# Patient Record
Sex: Male | Born: 1980 | Race: Asian | Hispanic: No | Marital: Married
Health system: Southern US, Community
[De-identification: ages and names within clinical notes are randomized; demographics above are authoritative.]

---

## 2015-05-25 ENCOUNTER — Emergency Department (HOSPITAL_BASED_OUTPATIENT_CLINIC_OR_DEPARTMENT_OTHER)
Admission: EM | Admit: 2015-05-25 | Discharge: 2015-05-25 | Disposition: A | Discharge status: Home / Self Care | Payer: BLUE CROSS/BLUE SHIELD | Attending: Emergency Medicine | Admitting: Emergency Medicine

## 2015-05-25 ENCOUNTER — Encounter (HOSPITAL_BASED_OUTPATIENT_CLINIC_OR_DEPARTMENT_OTHER): Payer: Self-pay

## 2015-05-25 ENCOUNTER — Emergency Department (HOSPITAL_BASED_OUTPATIENT_CLINIC_OR_DEPARTMENT_OTHER): Payer: BLUE CROSS/BLUE SHIELD

## 2015-05-25 DIAGNOSIS — R062 Wheezing: Secondary | ICD-10-CM | POA: Diagnosis present

## 2015-05-25 DIAGNOSIS — J988 Other specified respiratory disorders: Secondary | ICD-10-CM | POA: Insufficient documentation

## 2015-05-25 MED ORDER — PREDNISONE 20 MG PO TABS
40.0000 mg | ORAL_TABLET | Freq: Once | ORAL | Status: AC
  Administered 2015-05-25: 40 mg via ORAL
  Filled 2015-05-25: qty 2

## 2015-05-25 MED ORDER — IPRATROPIUM-ALBUTEROL 0.5-2.5 (3) MG/3ML IN SOLN
3.0000 mL | RESPIRATORY_TRACT | Status: AC
  Administered 2015-05-25 (×3): 3 mL via RESPIRATORY_TRACT
  Filled 2015-05-25: qty 3
  Filled 2015-05-25: qty 6

## 2015-05-25 MED ORDER — ALBUTEROL SULFATE HFA 108 (90 BASE) MCG/ACT IN AERS
8.0000 | INHALATION_SPRAY | Freq: Once | RESPIRATORY_TRACT | Status: AC
  Administered 2015-05-25: 8 via RESPIRATORY_TRACT
  Filled 2015-05-25: qty 6.7

## 2015-05-25 MED ORDER — PREDNISONE 20 MG PO TABS: ORAL_TABLET | ORAL | Status: DC

## 2015-05-25 MED ORDER — AEROCHAMBER PLUS W/MASK MISC
1.0000 | Freq: Once | Status: DC
  Filled 2015-05-25: qty 1

## 2015-05-25 NOTE — ED Notes (Signed)
Wheezing since yesterday. "Always happens during this season" No hx of asthma.

## 2015-05-25 NOTE — Discharge Instructions (Signed)
Use your inhaler ever four hours while awake for the next couple days.

## 2015-05-25 NOTE — ED Notes (Signed)
Pt states he feels better. BBS-mild crackles in bases.

## 2015-05-25 NOTE — ED Notes (Signed)
MD at bedside. 

## 2015-05-25 NOTE — ED Notes (Signed)
Allergy symptoms for two weeks with cough and wheezing starting two days ago.  He states this happens every year around this time.  Pt increasingly SOB as he speaks and ambulates.

## 2015-05-25 NOTE — ED Provider Notes (Signed)
CSN: [id number]     Arrival date & time 05/25/15  2117 History  By signing my name below, I, [facility], attest that this documentation has been prepared under the direction and in the presence of Melene Plan, DO. Electronically Signed: Randell Patient, ED Scribe. 05/25/2015. 10:47 PM.   Chief Complaint  Patient presents with  . Wheezing   Patient is a 35 y.o. male presenting with wheezing. The history is provided by the patient. No language interpreter was used.  Wheezing Severity:  Mild Severity compared to prior episodes:  Similar Onset quality:  Gradual Duration:  2 days Timing:  Intermittent Progression:  Unchanged Chronicity:  Recurrent Context: pollens   Worsened by:  Allergens Ineffective treatments:  Oral decongestants Associated symptoms: cough   Associated symptoms: no chest pain, no fever, no headaches, no rash and no shortness of breath    HPI Comments: Bradley Jarvis is a 35 y.o. male who presents to the Emergency Department complaining of intermittent, mild wheezing onset 2 days ago. Patient reports that he has had allergy symptoms including congestion for the past 2 weeks but that wheezing and cough presented simultaneously 2 days ago. He has taken OTC medications without relief. He notes similar symptoms in the past with seasonal changes during the spring. Denies hx of asthma. Denies SOB or any other symptoms currently.  History reviewed. No pertinent past medical history. History reviewed. No pertinent past surgical history. No family history on file. Social History  Substance Use Topics  . Smoking status: None  . Smokeless tobacco: None  . Alcohol Use: None    Review of Systems  Constitutional: Negative for fever and chills.  HENT: Positive for congestion. Negative for facial swelling.   Eyes: Negative for discharge and visual disturbance.  Respiratory: Positive for cough and wheezing. Negative for shortness of breath.   Cardiovascular: Negative for chest  pain and palpitations.  Gastrointestinal: Negative for vomiting, abdominal pain and diarrhea.  Musculoskeletal: Negative for myalgias and arthralgias.  Skin: Negative for color change and rash.  Neurological: Negative for tremors, syncope and headaches.  Psychiatric/Behavioral: Negative for confusion and dysphoric mood.    Allergies  Review of patient's allergies indicates no known allergies.  Home Medications   Prior to Admission medications   Not on File   SpO2 95% Physical Exam  Constitutional: He is oriented to person, place, and time. He appears well-developed and well-nourished.  HENT:  Head: Normocephalic and atraumatic.  Swollen turbinates and post-nasal drip.  Eyes: EOM are normal. Pupils are equal, round, and reactive to light.  Neck: Normal range of motion. Neck supple. No JVD present.  Cardiovascular: Normal rate and regular rhythm.  Exam reveals no gallop and no friction rub.   No murmur heard. Pulmonary/Chest: No respiratory distress. He has wheezes.  Diffuse wheezing in all lung fields. Prolonged expirations.   Abdominal: He exhibits no distension. There is no rebound and no guarding.  Musculoskeletal: Normal range of motion.  Neurological: He is alert and oriented to person, place, and time.  Skin: No rash noted. No pallor.  Psychiatric: He has a normal mood and affect. His behavior is normal.  Nursing note and vitals reviewed.   ED Course  Procedures   DIAGNOSTIC STUDIES: Oxygen Saturation is 96% on RA, adequate by my interpretation.    COORDINATION OF CARE: 9:51 PM Ordered breathing treatment, prednisone, and chest x-ray. Discussed treatment plan with pt at bedside and pt agreed to plan.  Imaging Review Dg Chest 2 View  05/25/2015  CLINICAL DATA:  Cough and wheezing EXAM: CHEST  2 VIEW COMPARISON:  None. FINDINGS: Lungs are clear. Heart size and pulmonary vascularity are normal. No adenopathy. No bone lesions. IMPRESSION: No edema or consolidation.  Electronically Signed   By: Bretta BangWilliam  Woodruff III M.D.   On: 05/25/2015 22:36   I have personally reviewed and evaluated these images as part of my medical decision-making.  MDM   Final diagnoses:  None    35 yo M With a chief complaint wheezing. Patient with significant improvement after 3 DuoNeb SPECT back and steroids. Discharge home with steroid burst therapy will have him use his inhaler every 4 hours while awake.  11:48 PM:  I have discussed the diagnosis/risks/treatment options with the patient and family and believe the pt to be eligible for discharge home to follow-up with PCP. We also discussed returning to the ED immediately if new or worsening sx occur. We discussed the sx which are most concerning (e.g., sudden worsening sob, need to use inhaler more often than every 4 hours, fever, inability to tolerate by mouth) that necessitate immediate return. Medications administered to the patient during their visit and any new prescriptions provided to the patient are listed below.  Medications given during this visit Medications  aerochamber plus with mask device 1 each (not administered)  ipratropium-albuterol (DUONEB) 0.5-2.5 (3) MG/3ML nebulizer solution 3 mL (3 mLs Nebulization Given 05/25/15 2213)  predniSONE (DELTASONE) tablet 40 mg (40 mg Oral Given 05/25/15 2216)  albuterol (PROVENTIL HFA;VENTOLIN HFA) 108 (90 Base) MCG/ACT inhaler 8 puff (8 puffs Inhalation Given 05/25/15 2315)    Discharge Medication List as of 05/25/2015 11:05 PM    START taking these medications   Details  predniSONE (DELTASONE) 20 MG tablet 2 tabs po daily x 4 days, Print        The patient appears reasonably screen and/or stabilized for discharge and I doubt any other medical condition or other [facility] requiring further screening, evaluation, or treatment in the ED at this time prior to discharge.     I personally performed the services described in this documentation, which was scribed in my presence.  The recorded information has been reviewed and is accurate.    Melene Planan Krithik Mapel, DO 05/25/15 2348

## 2015-06-06 ENCOUNTER — Emergency Department (HOSPITAL_BASED_OUTPATIENT_CLINIC_OR_DEPARTMENT_OTHER)
Admission: EM | Admit: 2015-06-06 | Discharge: 2015-06-06 | Disposition: A | Discharge status: Home / Self Care | Payer: BLUE CROSS/BLUE SHIELD | Attending: Emergency Medicine | Admitting: Emergency Medicine

## 2015-06-06 DIAGNOSIS — J45901 Unspecified asthma with (acute) exacerbation: Secondary | ICD-10-CM | POA: Diagnosis not present

## 2015-06-06 DIAGNOSIS — J302 Other seasonal allergic rhinitis: Secondary | ICD-10-CM | POA: Diagnosis not present

## 2015-06-06 DIAGNOSIS — Z79899 Other long term (current) drug therapy: Secondary | ICD-10-CM | POA: Diagnosis not present

## 2015-06-06 DIAGNOSIS — Z9109 Other allergy status, other than to drugs and biological substances: Secondary | ICD-10-CM

## 2015-06-06 DIAGNOSIS — J45909 Unspecified asthma, uncomplicated: Secondary | ICD-10-CM

## 2015-06-06 DIAGNOSIS — Z760 Encounter for issue of repeat prescription: Secondary | ICD-10-CM | POA: Diagnosis not present

## 2015-06-06 DIAGNOSIS — R0602 Shortness of breath: Secondary | ICD-10-CM | POA: Diagnosis present

## 2015-06-06 MED ORDER — ALBUTEROL SULFATE HFA 108 (90 BASE) MCG/ACT IN AERS: 1.0000 | INHALATION_SPRAY | Freq: Four times a day (QID) | RESPIRATORY_TRACT | Status: AC | PRN

## 2015-06-06 MED ORDER — ALBUTEROL SULFATE HFA 108 (90 BASE) MCG/ACT IN AERS
1.0000 | INHALATION_SPRAY | RESPIRATORY_TRACT | Status: DC | PRN
  Filled 2015-06-06 (×2): qty 6.7

## 2015-06-06 NOTE — ED Provider Notes (Signed)
CSN: [id number]     Arrival date & time 06/06/15  [id number] History   First MD Initiated Contact with Patient 06/06/15 [id number]     Chief Complaint  Patient presents with  . Medication Refill     (Consider location/radiation/quality/duration/timing/severity/associated sxs/prior Treatment) HPI Comments: Pt states that he is here to get a new inhaler.  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  He has asthma attacks at work but now inhaler is out.  Currently he denies any shortness of breath or wheezing. He is taking Zyrtec and Flonase daily. He does not have a regular doctor.  The history is provided by the patient.    No past medical history on file. No past surgical history on file. No family history on file. Social History  Substance Use Topics  . Smoking status: Not on file  . Smokeless tobacco: Not on file  . Alcohol Use: Not on file    Review of Systems  All other systems reviewed and are negative.     Allergies  Review of patient's allergies indicates no known allergies.  Home Medications   Prior to Admission medications   Medication Sig Start Date End Date Taking? Authorizing Provider  albuterol (PROVENTIL HFA;VENTOLIN HFA) 108 (90 Base) MCG/ACT inhaler Inhale 1-2 puffs into the lungs every 6 (six) hours as needed for wheezing or shortness of breath. 06/06/15   Gwyneth SproutWhitney Elfida Shimada, MD   BP 131/65 mmHg  Pulse 81  Temp(Src) 98.7 F (37.1 C) (Oral)  Resp 20  Ht 5\' 6"  (1.676 m)  Wt 165 lb (74.844 kg)  BMI 26.64 kg/m2  SpO2 95% Physical Exam  Constitutional: He is oriented to person, place, and time. He appears well-developed and well-nourished. No distress.  HENT:  Head: Normocephalic and atraumatic.  Eyes: EOM are normal. Pupils are equal, round, and reactive to light. Right conjunctiva is injected. Left conjunctiva is injected.  Cardiovascular: Normal rate, regular rhythm and normal heart sounds.   No murmur heard. Pulmonary/Chest: Effort  normal and breath sounds normal. No respiratory distress. He has no wheezes. He has no rales.  Neurological: He is alert and oriented to person, place, and time.  Skin: Skin is warm and dry. No rash noted. No erythema.  Psychiatric: He has a normal mood and affect. His behavior is normal.  Nursing note and vitals reviewed.   ED Course  Procedures (including critical care time) Labs Review Labs Reviewed - No data to display  Imaging Review No results found. I have personally reviewed and evaluated these images and lab results as part of my medical decision-making.   EKG Interpretation None      MDM   Final diagnoses:  Asthma, unspecified asthma severity, uncomplicated  Environmental allergies    Patient is here currently for a new inhaler. He has seasonal and environmental allergies. He works in a [employer] shop which typically does irritate his asthma but with the pollen it has made it much worse. He ran out of his inhaler nurse requesting a new one before his breathing gets bad again. He currently is in no distress at this time and always takes allergy medication.    Gwyneth SproutWhitney Waynetta Metheny, MD 06/06/15 [phone]

## 2015-06-06 NOTE — ED Notes (Signed)
Here April  [facility]  And also an inhaler

## 2015-06-06 NOTE — Discharge Instructions (Signed)
Allergies °An allergy is when your body reacts to a substance in a way that is not normal. An allergic reaction can happen after you: °· Eat something. °· Breathe in something. °· Touch something. °WHAT KINDS OF ALLERGIES ARE THERE? °You can be allergic to: °· Things that are only around during certain seasons, like molds and pollens. °· Foods. °· Drugs. °· Insects. °· Animal dander. °WHAT ARE SYMPTOMS OF ALLERGIES? °· Puffiness (swelling). This may happen on the lips, face, tongue, mouth, or throat. °· Sneezing. °· Coughing. °· Breathing loudly (wheezing). °· Stuffy nose. °· Tingling in the mouth. °· A rash. °· Itching. °· Itchy, red, puffy areas of skin (hives). °· Watery eyes. °· Throwing up (vomiting). °· Watery poop (diarrhea). °· Dizziness. °· Feeling faint or fainting. °· Trouble breathing or swallowing. °· A tight feeling in the chest. °· A fast heartbeat. °HOW ARE ALLERGIES DIAGNOSED? °Allergies can be diagnosed with: °· A medical and family history. °· Skin tests. °· Blood tests. °· A food diary. A food diary is a record of all the foods, drinks, and symptoms you have each day. °· The results of an elimination diet. This diet involves making sure not to eat certain foods and then seeing what happens when you start eating them again. °HOW ARE ALLERGIES TREATED? °There is no cure for allergies, but allergic reactions can be treated with medicine. Severe reactions usually need to be treated at a hospital.  °HOW CAN REACTIONS BE PREVENTED? °The best way to prevent an allergic reaction is to avoid the thing you are allergic to. Allergy shots and medicines can also help prevent reactions in some cases. °  °This information is not intended to replace advice given to you by your health care provider. Make sure you discuss any questions you have with your health care provider. °  °Document Released: 05/25/2012 Document Revised: 02/18/2014 Document Reviewed: 11/09/2013 °Elsevier Interactive Patient Education ©2016  [employer] ° °

## 2016-11-16 IMAGING — CR DG CHEST 2V
2 series · 2 of 2 positions shown · non-contrast
Comparison: None.

CLINICAL DATA: Cough and wheezing

EXAM:
CHEST  2 VIEW

[w chest pa]
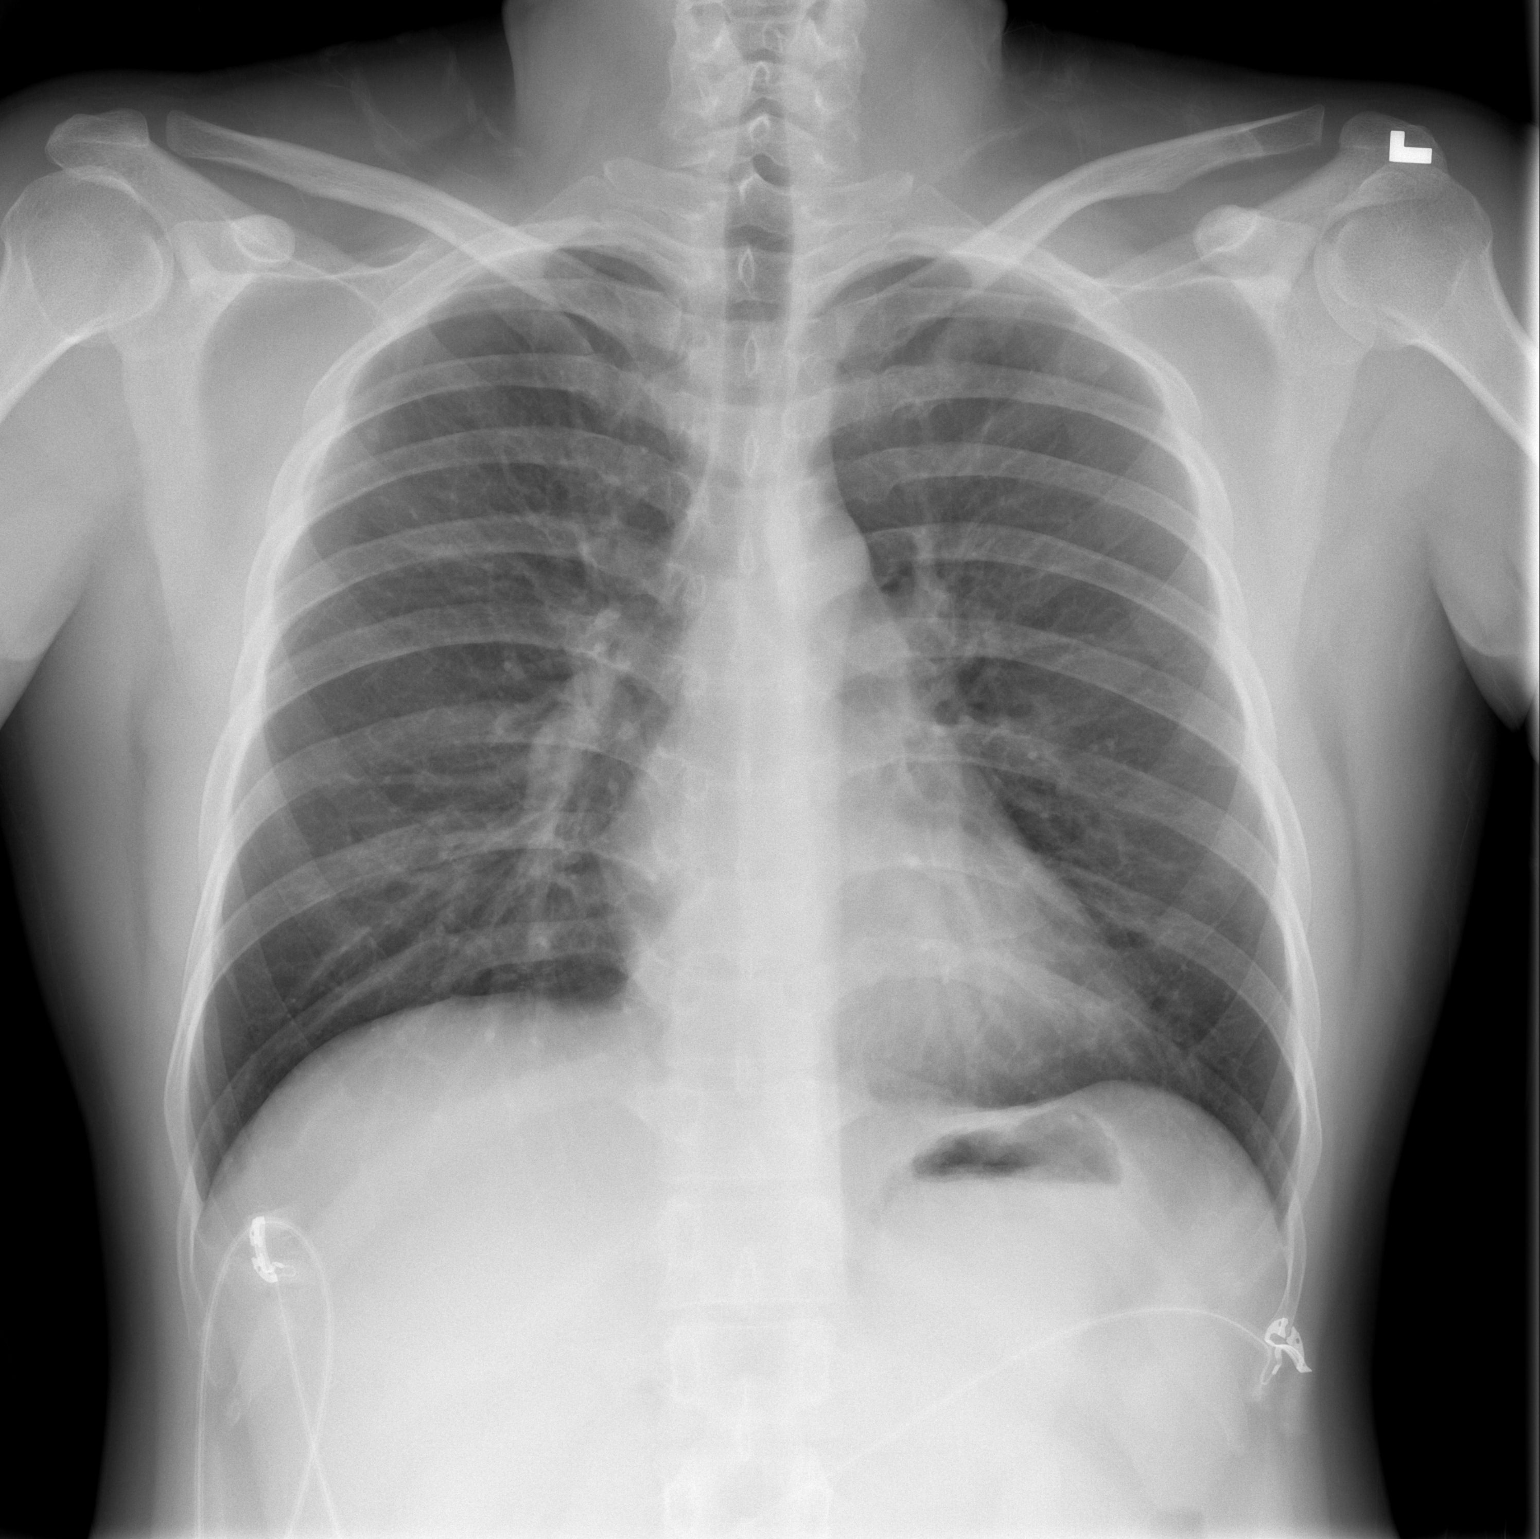

[w chest lat]
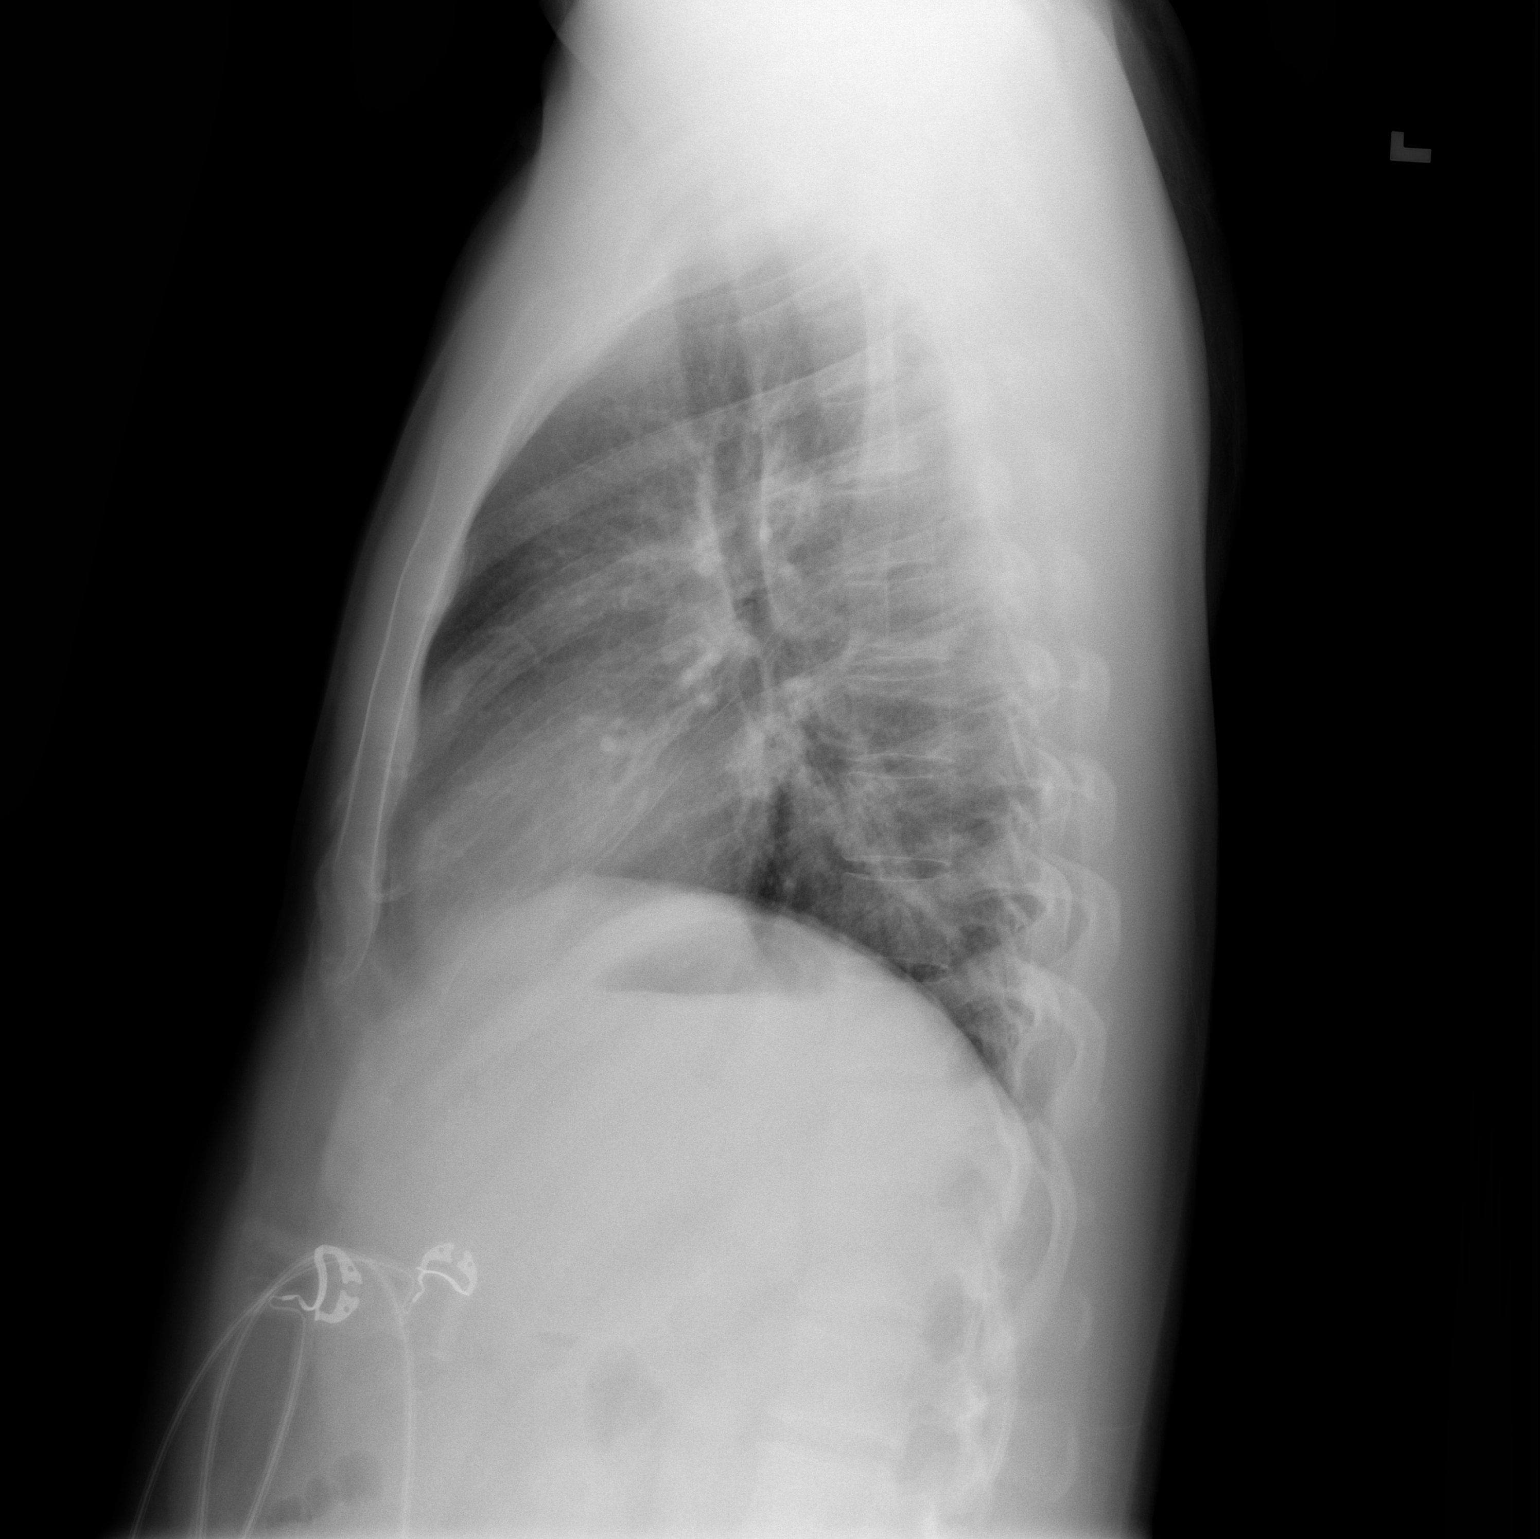

[2 of 2 positions shown; findings below may reference images not displayed]

FINDINGS: Lungs are clear. Heart size and pulmonary vascularity are normal. No
adenopathy. No bone lesions.
IMPRESSION: No edema or consolidation.
